# Patient Record
Sex: Female | Born: 1970 | Race: White | Hispanic: No | Marital: Single
Health system: Midwestern US, Academic
[De-identification: ages and names within clinical notes are randomized; demographics above are authoritative.]

---

## 2017-10-12 ENCOUNTER — Encounter: Admit: 2017-10-12 | Discharge: 2017-10-12 | Payer: BC Managed Care – PPO

## 2017-10-12 DIAGNOSIS — M549 Dorsalgia, unspecified: Principal | ICD-10-CM

## 2017-10-17 ENCOUNTER — Encounter: Admit: 2017-10-17 | Discharge: 2017-10-17 | Payer: BC Managed Care – PPO

## 2017-10-17 ENCOUNTER — Ambulatory Visit: Admit: 2017-10-17 | Discharge: 2017-10-17 | Payer: BC Managed Care – PPO

## 2017-10-17 DIAGNOSIS — F419 Anxiety disorder, unspecified: ICD-10-CM

## 2017-10-17 DIAGNOSIS — T148XXA Other injury of unspecified body region, initial encounter: ICD-10-CM

## 2017-10-17 DIAGNOSIS — M549 Dorsalgia, unspecified: Principal | ICD-10-CM

## 2017-10-17 DIAGNOSIS — S39012A Strain of muscle, fascia and tendon of lower back, initial encounter: ICD-10-CM

## 2017-10-17 DIAGNOSIS — I10 Essential (primary) hypertension: ICD-10-CM

## 2017-10-17 DIAGNOSIS — K219 Gastro-esophageal reflux disease without esophagitis: ICD-10-CM

## 2017-10-17 DIAGNOSIS — Z9889 Other specified postprocedural states: ICD-10-CM

## 2017-10-17 DIAGNOSIS — E079 Disorder of thyroid, unspecified: ICD-10-CM

## 2017-10-17 DIAGNOSIS — T7840XA Allergy, unspecified, initial encounter: Principal | ICD-10-CM

## 2018-01-16 ENCOUNTER — Encounter: Admit: 2018-01-16 | Discharge: 2018-01-16 | Payer: BC Managed Care – PPO

## 2018-01-16 ENCOUNTER — Ambulatory Visit: Admit: 2018-01-16 | Discharge: 2018-01-17 | Payer: BC Managed Care – PPO

## 2018-01-16 DIAGNOSIS — T148XXA Other injury of unspecified body region, initial encounter: ICD-10-CM

## 2018-01-16 DIAGNOSIS — K219 Gastro-esophageal reflux disease without esophagitis: ICD-10-CM

## 2018-01-16 DIAGNOSIS — Z9889 Other specified postprocedural states: ICD-10-CM

## 2018-01-16 DIAGNOSIS — S39012D Strain of muscle, fascia and tendon of lower back, subsequent encounter: Principal | ICD-10-CM

## 2018-01-16 DIAGNOSIS — I10 Essential (primary) hypertension: ICD-10-CM

## 2018-01-16 DIAGNOSIS — F419 Anxiety disorder, unspecified: ICD-10-CM

## 2018-01-16 DIAGNOSIS — T7840XA Allergy, unspecified, initial encounter: Principal | ICD-10-CM

## 2018-01-16 DIAGNOSIS — E079 Disorder of thyroid, unspecified: ICD-10-CM

## 2021-05-26 ENCOUNTER — Inpatient Hospital Stay
Admit: 2021-05-26 | Discharge: 2021-05-26 | Payer: PRIVATE HEALTH INSURANCE | Attending: Student in an Organized Health Care Education/Training Program

## 2021-05-26 DIAGNOSIS — K029 Dental caries, unspecified: Secondary | ICD-10-CM

## 2021-05-26 NOTE — Unmapped (Signed)
[place]  Oral Maxillofacial Surgery      Visit Type:  OMS NPV  Pt. Name: Gina Frey  Pt. MRN: [id number]  DOB: [birth year]              Sex: female  Visit Date:  05/26/2021  Provider: Attending: Harlow Mares, DMD  Resident: Danton Clap, DDS  Location of Care: [facility]    Subjective:      Chief Complaint: I need all my teeth out    Gina Frey is a/an 50 y.o. female referred from general dentist for extraction of remaining dentition #2-17,20-31.  Patient reports intermittent pain that is well controlled with pain medications. Pt to have dentures made by general dentist. Pt denies swelling, drainage, trismus, NVFC, dyspnea, dysphagia, dysphonia, shortness of breath, chest pain or any other symptoms.    Past Med/Surg/Family/Social History:    Allergies:   Tetnus  Medical History:   CVA, DMII, Asthma, HTN, HLD, Seizures  Medications:   Levemir, HTZ, Prednisone 60mg , Aspirin, Cetrizine, Iron, Metformin, Amlodipine  Surgical History:  ??? Left Cardiac Catheterization N/A 12/08/2020   LEFT CARDIAC CATHETERIZATION performed by Shelby Mattocks, MD at [facility] CATH LAB   ??? Other Surgical History   tonsillectomy   ??? Other Surgical History   kidney stones removed     Social History:  Social History     Occupational History   ??? Not on file   Tobacco Use   ??? Smoking status: Not on file   ??? Smokeless tobacco: Not on file   Substance and Sexual Activity   ??? Alcohol use: Not on file   ??? Drug use: Not on file   ??? Sexual activity: Not on file       ROS:   Review of Systems: 10-point ROS completed and is negative except noted in H&P.         Objective:     Vitals:    05/26/21 1016   BP: 140/68     Body mass index is 37.59 kg/m??.    Physical Exam    Maxillofacial:   ?? Atraumatic  ?? Normo-cephalic  ?? No facial swelling  ?? No cervical masses or LAD  ?? No pain to digital palpation - bilaterally  ?? No clicking/popping/crepitus of TMJ  ?? Normal anterior and laterotrusive movements  ?? No trismus  ?? CN II-XII  intact  ?? Maximum incisal opening WNL    Oral:   ?? Caries noted on remaining dentition  ?? Normal salivary flow, mucosa moist and pink  ?? No vestibular edema/swelling/erythema  ?? No uvular deviation, FOM soft and non-tender  ?? No signs of acute infection  ?? No purulence or drainage or fistulae noted  ?? No soft tissue pathology      Radiographic Evaluation/Imaging    Maxillary sinuses are equal in size and radiodensity. Mandibular condyles are well-formed and seated in the glenoid fossa.  No other radiographic evidence of maxillary or mandibular pathology. Caries noted on remaining dentition. Generalized bone loss.    Assessment/Plan:     Gina Frey is a/an 50 y.o. female with symptomatic, terminal teeth indicated for extraction under local anesthesia. Complex PMH with CVA with episodes in 11/21 and 3/22. With the most recent being accompanied by a seizure which was the pt's first of life and has not had another seizure since. Pt not on any blood thinners. DMII with last a1c of 7.6. pt to have biopsy by neurology for possible temporal  arteritis next week. Pt is on 60mg  prednisone daily for this. Discussed with pt that it would be beneficial to postpone surgery until after discontinuation of prednisone. Plan to complete MMOA over 4 separate appointments to limit stress.    ASA Classification: 3    1. Return for [facility] under local anesthesia over 4 appointments.  2. Informed consent will be obtained on day of surgery.  3. Risks & Benefits: Risks, benefits, complications and treatment options discussed with patient.    Danton Clap, DDS  05/26/2021 2:52 PM  [facility]  [facility] CENTER ORAL AND MAXILLOFACIAL SURGERY AT [facility]  [address]  [place] [address]  Dept: [phone]

## 2021-06-15 NOTE — Unmapped (Signed)
Received referral from [facility] to schedule patient for aud/hae. Patient has C[facility]. Referred to [facility]. S/W patient and gave them their telephone number. Faxed referral to [facility]. Also called referring office and was left on hold. Hung up. Sent fax to referring phy office alerting them that we sent referralto [facility] as we do not take [employer] for Aids.  Sent to scanning.

## 2021-09-18 ENCOUNTER — Inpatient Hospital Stay: Admit: 2021-09-18 | Discharge: 2021-09-18 | Payer: PRIVATE HEALTH INSURANCE

## 2021-09-18 DIAGNOSIS — G8918 Other acute postprocedural pain: Secondary | ICD-10-CM

## 2021-09-18 MED ORDER — chlorhexidine (PERIDEX) 0.12 % solution
0.12 | Freq: Two times a day (BID) | 0 refills | 16.00000 days | Status: AC
Start: 2021-09-18 — End: ?

## 2021-09-18 MED ORDER — oxyCODONE (ROXICODONE) 5 MG immediate release tablet
5 | ORAL_TABLET | Freq: Four times a day (QID) | ORAL | 0 refills | 6.00000 days | Status: AC | PRN
Start: 2021-09-18 — End: 2021-09-20

## 2021-09-18 MED ORDER — ibuprofen (MOTRIN) 600 MG tablet
600 | ORAL_TABLET | Freq: Four times a day (QID) | ORAL | 0 refills | Status: AC | PRN
Start: 2021-09-18 — End: ?

## 2021-09-18 MED ORDER — acetaminophen (TYLENOL) 325 MG tablet
325 | ORAL_TABLET | Freq: Four times a day (QID) | ORAL | 0 refills | 11.00000 days | Status: AC | PRN
Start: 2021-09-18 — End: ?

## 2021-09-18 NOTE — Unmapped (Signed)
[place]   Oral Maxillofacial Surgery      Pt. Name: Gina Frey  Pt. MRN: [id number] DOB: Feb 03, 1971  Provider: Lamount Cohen, DDS  Resident: Dewain Penning, DDS  Location of Care: [facility] at [facility]    Procedure: Extraction #9-16  Date of Procedure: 09/18/2021    Reviewed H&P; no significant changes to medical history or medications.    Informed Consent/Counseling Statement:  Plan, alternatives and risks of procedure have been explained to and discussed with the patient . By my assessment, they understand and agree. Scenario presented in detail. Question answered.    Anesthesia:   Local Anesthesia   Informed consent was performed. Time Out performed.    Local anesthesia administered: 3 carpules of lidocaine 2% with 1:100k epi.                                                        Anesthesia administered via block and local infiltration. After anesthesia was achieved, gauze throat pack placed and bite block inserted.    Maxillary Left Quadrant  Local Anesthesia administered in maxillary left quadrant via block and local infiltration. After anesthesia achieved, #15 scalpel blade used to make sulcular incision from distal of terminal tooth to the midline. Full-thickness mucoperisteal flap then elevated using periosteal elevator. Using combination of dental elevators and forceps, teeth # 9, 10, 11, 12, 13, 14, 15 and 16 were extracted without complication. Copious normal saline irrigation of extraction sites and underneath the flap. Reapproximation of tissues then completed using chromic gut 3.0 suture in a standard running fashion. Gauze packs placed, hemostasis achieved.  Gauze pack placed, hemostasis achieved.      Complications/Abnormal findings: N/A  Estimated Blood Loss: Minimal  Patient tolerated anesthesia and procedure well.    Rx: Acetaminophen 650mg  x Q6H PRN x 56 tabs, Ibuprofen 600mg  x Q6H PRN x 30 tabs, Peridex 0.12% rinse S/S 15mL BID x 1 week  bottle, Oxycodone 5mg  x Q6H PRN x 6 tabs  Postoperative instructions given both verbally and written. Extra gauze packs given to patient.  Disposition: Patient to follow up on an as needed basis    Signed by: Dewain Penning  Date: 09/18/2021 Time: 3:19 PM

## 2021-10-02 ENCOUNTER — Inpatient Hospital Stay
Admit: 2021-10-02 | Discharge: 2021-10-02 | Payer: PRIVATE HEALTH INSURANCE | Attending: Student in an Organized Health Care Education/Training Program

## 2021-10-02 DIAGNOSIS — K056 Periodontal disease, unspecified: Secondary | ICD-10-CM

## 2021-10-02 LAB — POC GLU MONITORING DEVICE: POC Glucose Monitoring Device: 161 mg/dL (ref 70–100)

## 2021-10-02 MED ORDER — ibuprofen (MOTRIN) 600 MG tablet
600 | ORAL_TABLET | Freq: Four times a day (QID) | ORAL | 0 refills | Status: AC | PRN
Start: 2021-10-02 — End: ?

## 2021-10-02 MED ORDER — oxyCODONE (ROXICODONE) 5 MG immediate release tablet
5 | ORAL_TABLET | Freq: Four times a day (QID) | ORAL | 0 refills | 6.00000 days | Status: AC | PRN
Start: 2021-10-02 — End: 2021-10-07

## 2021-10-02 MED ORDER — HYDROcodone-acetaminophen (NORCO) 5-325 mg per tablet
5-325 | ORAL_TABLET | Freq: Four times a day (QID) | ORAL | 0 refills | 15.50000 days | Status: AC | PRN
Start: 2021-10-02 — End: 2021-10-02

## 2021-10-02 NOTE — Unmapped (Signed)
[place]   Oral Maxillofacial Surgery    Pt. Name: Gina Frey  Pt. MRN: [id number]  DOB: 02/06/1971            Sex: female  Provider: Maeola Harman, DMD  Resident:Jakub Debold Merri Brunette, DMD  Location of Care: [facility] at [facility]    Procedure: Full Mouth Extractions  Date of Procedure: 10/02/2021    H&P/Consult within 30 days of procedure: No (no changes)    Informed Consent/Counseling Statement:  Plan, alternatives and risks of anesthesia, including death have been explained to and discussed with the patient/legal guardian.  By my assessment, the patient/legal guardian understands and agrees. Scenario presented in detail. Question answered.    Anesthesia: Local  Informed consent was performed. Time Out performed. Standard ASA monitors were instituted.    Gauze throat pack placed in oropharynx, bite block inserted.  Local anesthesia administered: 6 carpules of lidocaine 2% with 1:100k epi.                                  1 carpules of marcaine 0.5% with 1:200k epi.                                 Maxillary Right Quadrant  Local Anesthesia administered in maxillary right quadrant via block and local infiltration. After anesthesia achieved, #15 scalpel blade used to make sulcular incision from distal of terminal tooth to the midline. Full-thickness mucoperisteal flap then elevated using periosteal elevator. Using combination of dental elevators and forceps, teeth # 2, 3, 4, 5, 6, 7 and 8 were extracted without complication. Alveoloplasty of the maxillary right quadrant was then completed using a rongeur and bone rasp. Copious normal saline irrigation of extraction sites and underneath the flap. Reapproximation of tissues then completed using chromic gut 3.0 suture in a standard running fashion. Gauze packs placed, hemostasis achieved.    Mandibular Right Quadrant  Local Anesthesia administered in mandibular right quadrant via block and local infiltration. After anesthesia  achieved, #15 scalpel blade used to make sulcular incision from distal of terminal tooth to the midline. Full-thickness mucoperisteal flap then elevated using periosteal elevator. Using combination of dental elevators and forceps, teeth # 25, 26, 27, 28, 29, 30 and 31 were extracted without complication. Alveoloplasty of the mandibular right quadrant was then completed using a rongeur and bone rasp. Copious normal saline irrigation of extraction sites and underneath the flap. Reapproximation of tissues then completed using chromic gut 3.0 suture in a standard running fashion. Gauze packs placed, hemostasis achieved.      Thorough suctioning of the oropharynx, removal of throat pack and bite block.  Complications/Abnormal findings: none  Estimated Blood Loss: Minimal  Patient tolerated anesthesia and procedure well.    Rx: Ibuprofen 600mg  x 30 tabs  Tylenol 500mg  x 30 tabs  Norco 5/325mg  x 12 tabs    Postoperative instructions given both verbally and written. Extra gauze packs given to patient.  Disposition: Patient to return for LLQ extractions at next visit 10/16/21 to complete MMOA    Signed by: Murriel Hopper  Date: 10/02/21   Time: 12:30 PM

## 2021-10-16 ENCOUNTER — Inpatient Hospital Stay
Admit: 2021-10-16 | Discharge: 2021-10-16 | Payer: PRIVATE HEALTH INSURANCE | Attending: Student in an Organized Health Care Education/Training Program

## 2021-10-16 DIAGNOSIS — K029 Dental caries, unspecified: Secondary | ICD-10-CM

## 2021-10-16 LAB — POC GLU MONITORING DEVICE: POC Glucose Monitoring Device: 150 mg/dL (ref 70–100)

## 2021-10-16 MED ORDER — oxyCODONE (ROXICODONE) 5 MG immediate release tablet
5 | ORAL_TABLET | Freq: Four times a day (QID) | ORAL | 0 refills | 6.00000 days | Status: AC | PRN
Start: 2021-10-16 — End: 2021-10-19

## 2021-10-16 MED ORDER — chlorhexidine (PERIDEX) 0.12 % solution
0.12 | Freq: Two times a day (BID) | 0 refills | 16.00000 days | Status: AC
Start: 2021-10-16 — End: ?

## 2021-10-16 MED ORDER — acetaminophen (TYLENOL) 500 MG tablet
500 | ORAL_TABLET | Freq: Four times a day (QID) | ORAL | 0 refills | 11.00000 days | Status: AC | PRN
Start: 2021-10-16 — End: ?

## 2021-10-16 NOTE — Unmapped (Signed)
[name]   Oral Maxillofacial Surgery    Pt. Name: Gina Frey  Pt. MRN: [id number]  DOB: [birth year]            Sex: female  Provider: Maeola HarmanMichael Grau, DMD  Resident:Cyle Kenyon, DDS  [facility]    Procedure: Full Mouth Extractions  Date of Procedure: 10/16/2021    H&P/Consult within 30 days of procedure: No (no changes)    Informed Consent/Counseling Statement:  Plan, alternatives and risks of anesthesia, including death have been explained to and discussed with the patient/legal guardian.  By my assessment, the patient/legal guardian understands and agrees. Scenario presented in detail. Question answered.    Anesthesia: Local  Informed consent was performed. Time Out performed. Standard ASA monitors were instituted.    Gauze throat pack placed in oropharynx, bite block inserted.  Local anesthesia administered: 3 carpules of lidocaine 2% with 1:100k epi.                                  1 carpules of marcaine 0.5% with 1:200k epi.                                   Mandibular Left Quadrant  Local Anesthesia administered in mandibular left quadrant via block and local infiltration. After anesthesia achieved, Full-thickness mucoperisteal flap then elevated using periosteal elevator. Using combination of dental elevators and forceps, teeth #17, 20, 21, 22, 23 and 24 were extracted without complication. Alveoloplasty of the mandibular left quadrant was then completed using a rongeur and bone rasp. Copious normal saline irrigation of extraction sites and underneath the flap. Reapproximation of tissues then completed using chromic gut 3.0 suture in a standard running fashion. Gauze packs placed, hemostasis achieved.    Thorough suctioning of the oropharynx, removal of throat pack and bite block.  Complications/Abnormal findings: n/a  Estimated Blood Loss: Minimal  Patient tolerated anesthesia and procedure well.    Rx: Oxycodone 5mg  x12  tabs  Tylenol 325mg  x 40 tabs  Peridex 473mL    Postoperative instructions given both verbally and written. Extra gauze packs given to patient.  Disposition: Patient to follow up on an as needed basis    Signed by: Danton ClapREVOR Zeya Balles  Date: 10/16/21   Time: 11:27 AM

## 2021-10-30 ENCOUNTER — Ambulatory Visit: Payer: PRIVATE HEALTH INSURANCE

## 2022-01-21 ENCOUNTER — Encounter: Admit: 2022-01-21 | Discharge: 2022-01-21 | Payer: BC Managed Care – PPO

## 2022-01-21 ENCOUNTER — Ambulatory Visit: Admit: 2022-01-21 | Discharge: 2022-01-21 | Payer: BC Managed Care – PPO

## 2022-01-21 DIAGNOSIS — M5417 Radiculopathy, lumbosacral region: Secondary | ICD-10-CM

## 2022-01-21 DIAGNOSIS — M62838 Other muscle spasm: Secondary | ICD-10-CM

## 2022-01-21 DIAGNOSIS — M47819 Spondylosis without myelopathy or radiculopathy, site unspecified: Secondary | ICD-10-CM

## 2022-01-21 DIAGNOSIS — M5137 Other intervertebral disc degeneration, lumbosacral region: Secondary | ICD-10-CM

## 2022-01-21 MED ORDER — TIZANIDINE 2 MG PO TAB
2-4 mg | ORAL_TABLET | Freq: Every evening | ORAL | 3 refills | Status: AC | PRN
Start: 2022-01-21 — End: ?

## 2022-01-21 NOTE — Progress Notes
Dear Dr. Christene Lye Darland,    I appreciate your kind referral of Briana Blankenship for evaluation of pain. Please see my note below for the full details of the evaluation and management plan.    Thank you,    Evelina Bucy, MD        [facility]  NEW PATIENT HISTORY AND PHYSICAL  Subjective     Chief Complaint: Pain  Chief Complaint   Patient presents with   ? Left Hip - Pain   ? Left Heel - Pain   ? New Patient       HPI: Briana Blankenship is a 51 y.o. female who  has a past medical history of Allergy, Anxiety disorder, GERD (gastroesophageal reflux disease), Hypertension, Nerve injury (06/2013), and Thyroid disease. who presents for evaluation.    The pain is in the LBP on the left side.     There is radicular pain down the LLE in the posterolateral distribution    Pain started: 0-6 months  Flared up since 09/2021    Initial inciting injury or event: No      Numbness/tingling: Leg      The pain averages Leg or foot  6/10    The pain is described as Aching, Stabbing, Nagging, Burning, Sharp, Unbearable      The pain is exacerbated by Other (comment) Varies      The pain is partially alleviated by Rest        +muscle stiffness/tightness    Patient endorses achiness/heaviness of legs with walking a long distance that improves with rest. Positive Shopping Cart sign.        PRIOR MEDICATIONS:   Effective      Ineffective  NSAID  Acetaminophen    Unable to tolerate  Flexeril    Never  Gabapentin   Lyrica  Ami/Nortriptyline  Cymbalta  Tizanidine      PRIOR INTERVENTIONS:  Lumbar discectomy around 2018  Effective      Ineffective  Physician-ordered PT  Chiropractor      Briana Blankenship denies any recent fevers, chills, infection, antibiotics, bowel or bladder incontinence, saddle anesthesia, bleeding issues, or recent anticoagulant.     ROS: All 14 systems reviewed and found to be negative except as above and as follows. +anxiety, reflux.     Past Medical History:  Medical History: Diagnosis Date   ? Allergy    ? Anxiety disorder    ? GERD (gastroesophageal reflux disease)    ? Hypertension    ? Nerve injury 06/2013   ? Thyroid disease        Family History:  Family History   Problem Relation Age of Onset   ? Hypertension Mother    ? Hypertension Father    ? Hypertension Sister    ? Hypertension Brother    ? Diabetes Paternal Grandmother    ? Hypertension Sister    ? Hypertension Brother        Social History:  Lives in [place] [address] (1 hour away)  Works as a Radiation protection practitioner.   Social History     Socioeconomic History   ? Marital status: Divorced   ? Number of children: 4   Occupational History   ? Occupation: Futures trader: [employer]   Tobacco Use   ? Smoking status: Former     Packs/day: 1.00     Years: 5.00     Pack years: 5.00  Types: Cigarettes     Quit date: 06/28/2010     Years since quitting: 11.5   ? Smokeless tobacco: Never   Substance and Sexual Activity   ? Alcohol use: Yes     Alcohol/week: 1.0 standard drink of alcohol     Types: 1 Shots of liquor per week   ? Drug use: No   ? Sexual activity: Yes     Partners: Male     Birth control/protection: Surgical       Allergies:  Allergies   Allergen Reactions   ? Relafen [Nabumetone] HIVES   ? Morphine VOMITING       Medications:    Current Outpatient Medications:   ?  amLODIPine (NORVASC) 10 mg tablet, Take one tablet by mouth daily., Disp: , Rfl:   ?  ibuprofen (ADVIL PO), Take  by mouth as Needed., Disp: , Rfl:   ?  lisinopril (PRINIVIL; ZESTRIL) 20 mg tablet, Take one tablet by mouth daily., Disp: , Rfl:     Physical examination:   BP (!) 166/88 (BP Source: Arm, Right Upper, Patient Position: Sitting)  - Pulse 58  - Ht 170.2 cm (5' 7)  - Wt 85.7 kg (189 lb)  - SpO2 100%  - BMI 29.60 kg/m?   Pain Score: Four  Oswestry Total Score:: 26    General: The patient is a well-developed, well nourished 51 y.o. female in no acute distress.   HEENT: Head is normocephalic and atraumatic. EOMI bilaterally.   Cardiac: Based on palpation, pulse appears to be regular rate and rhythm.   Pulmonary: The patient has unlabored respirations and bilateral symmetric chest excursion.   Abdomen: Soft, nontender, and nondistended. There is no rebound or guarding.   Extremities: No clubbing, cyanosis, or edema.     Neurologic:   The patient is alert and oriented times 3.   Cranial nerves II through XII are intact without any focal deficits.     Musculoskeletal:   Gait is mildly antalgic.    L-Spine   There is mild left low lumbar paraspinal tenderness. Paraspinal muscle tone is increased.  Facet loading is positive.  There is no tenderness or radiating pain with palpation over the SI joints, piriformis, or greater trochanteric bursae bilaterally.  ROM with flexion, extension, rotation, and lateral bending is intact.  Strength is equal and adequate bilaterally in the flexors and extensors of the bilateral lower extremities.   SLR is positive in the LLE.       CT Abd/Pelvis Results:  2019    Degenerative changes   scattered throughout spine.       XR L-Spine Results  2019    1. ?There is transitional lumbosacral anatomy with partial lumbarization   of S1. Lucency involving the right S1 pars interarticularis on the oblique   view, suggesting possible pars defect.     2. ?Vertebral body heights are maintained. There is normal lumbar   alignment without abnormal motion in flexion or extension.     3. ?Multilevel lumbar disc degeneration, greatest of moderate degree at   L5-S1. Mild facet arthrosis L5-S1.     4. ?Surgical clips overlie the right upper quadrant abdomen.         Last Cr and LFT's:  No results found for: CR, AST, ALT, ALKPHOS, TOTBILI       Assessment:    Briana Blankenship is a 51 y.o. female who  has a past medical history of Allergy, Anxiety disorder, GERD (gastroesophageal reflux disease),  Hypertension, Nerve injury (06/2013), and Thyroid disease. who presents for evaluation of pain.    The pain complaints are most likely due to:    1. Lumbosacral radiculopathy        2. DDD (degenerative disc disease), lumbosacral        3. Muscle spasm        4. Facet arthropathy            Patient has had an adequate trial of > 3 months of rest, exercise, multimodal treatment, and the passage of time without improvement of symptoms. The pain has significant impact on the daily quality of life.     Plan:    1. Plan for Left L4-S1 TFESI in PROCEDURES at first available appointment. She has partial lumbarization of S1.   2. Will trial tizanidine 2-4mg  qhs prn.   3. Check L-spine XR today.   4. Follow up as needed.     Risks/benefits of all pharmacologic and interventional treatments discussed and questions answered.     Thank you for this kind referral for consultation. Please feel free to contact me with any questions or concerns.

## 2023-01-06 ENCOUNTER — Encounter: Admit: 2023-01-06 | Discharge: 2023-01-06 | Payer: BC Managed Care – PPO

## 2023-01-10 ENCOUNTER — Encounter: Admit: 2023-01-10 | Discharge: 2023-01-10 | Payer: BC Managed Care – PPO

## 2023-01-11 ENCOUNTER — Encounter: Admit: 2023-01-11 | Discharge: 2023-01-11 | Payer: BC Managed Care – PPO

## 2023-01-11 NOTE — Telephone Encounter
01-11-2023 Per Task Message, request faxed to Dr Myriam Jacobson, (F) [phone], clp    Dr. Myriam Jacobson, [facility], [phone]

## 2023-02-10 ENCOUNTER — Encounter: Admit: 2023-02-10 | Discharge: 2023-02-10 | Payer: BC Managed Care – PPO

## 2023-02-24 IMAGING — CR [ID]
3 series · 3 of 3 positions shown · non-contrast
Comparison: none

[x foot ap left]
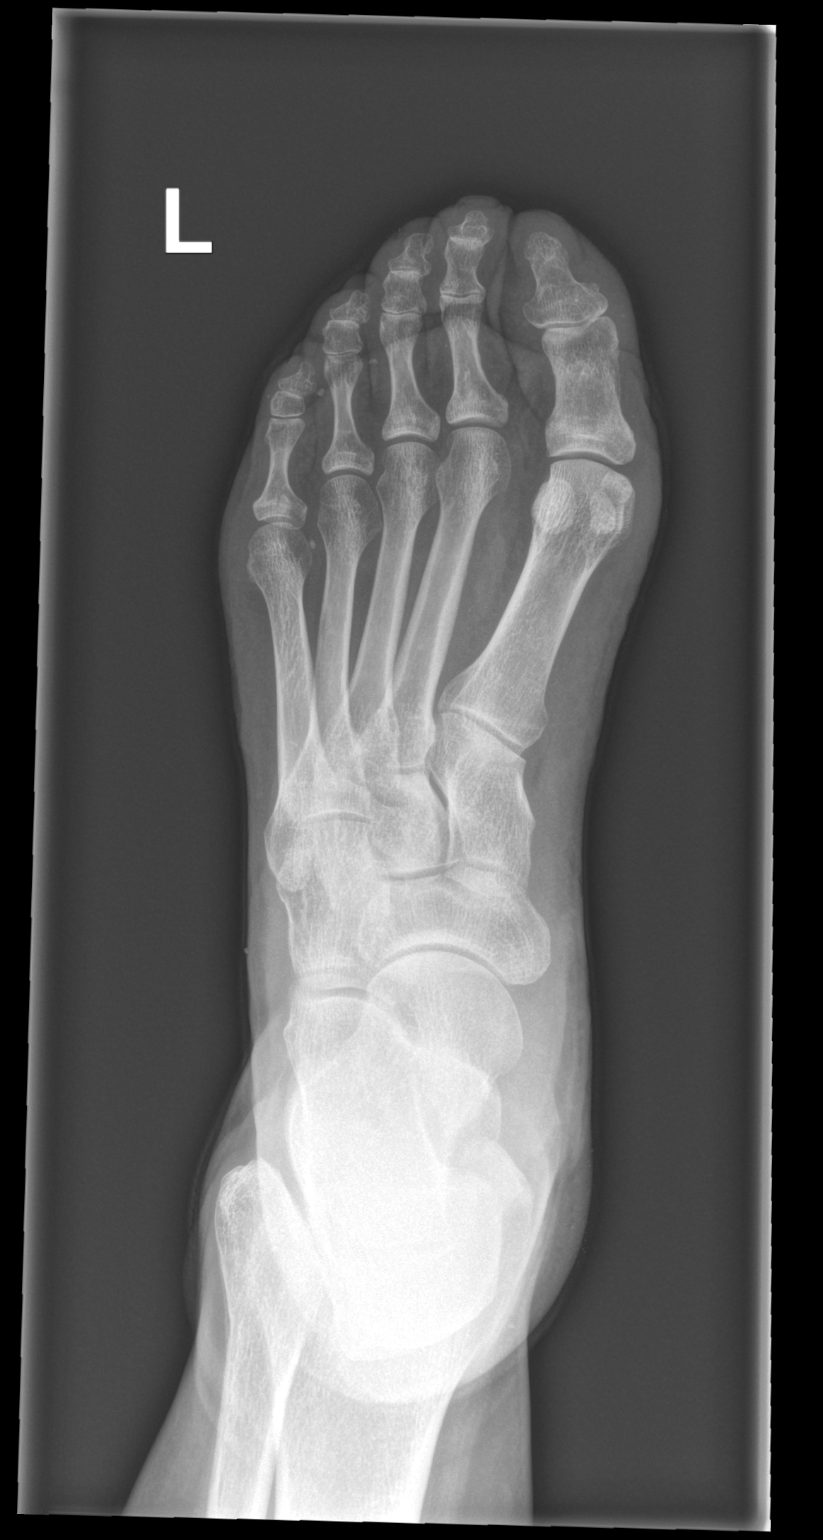

[x foot obl left]
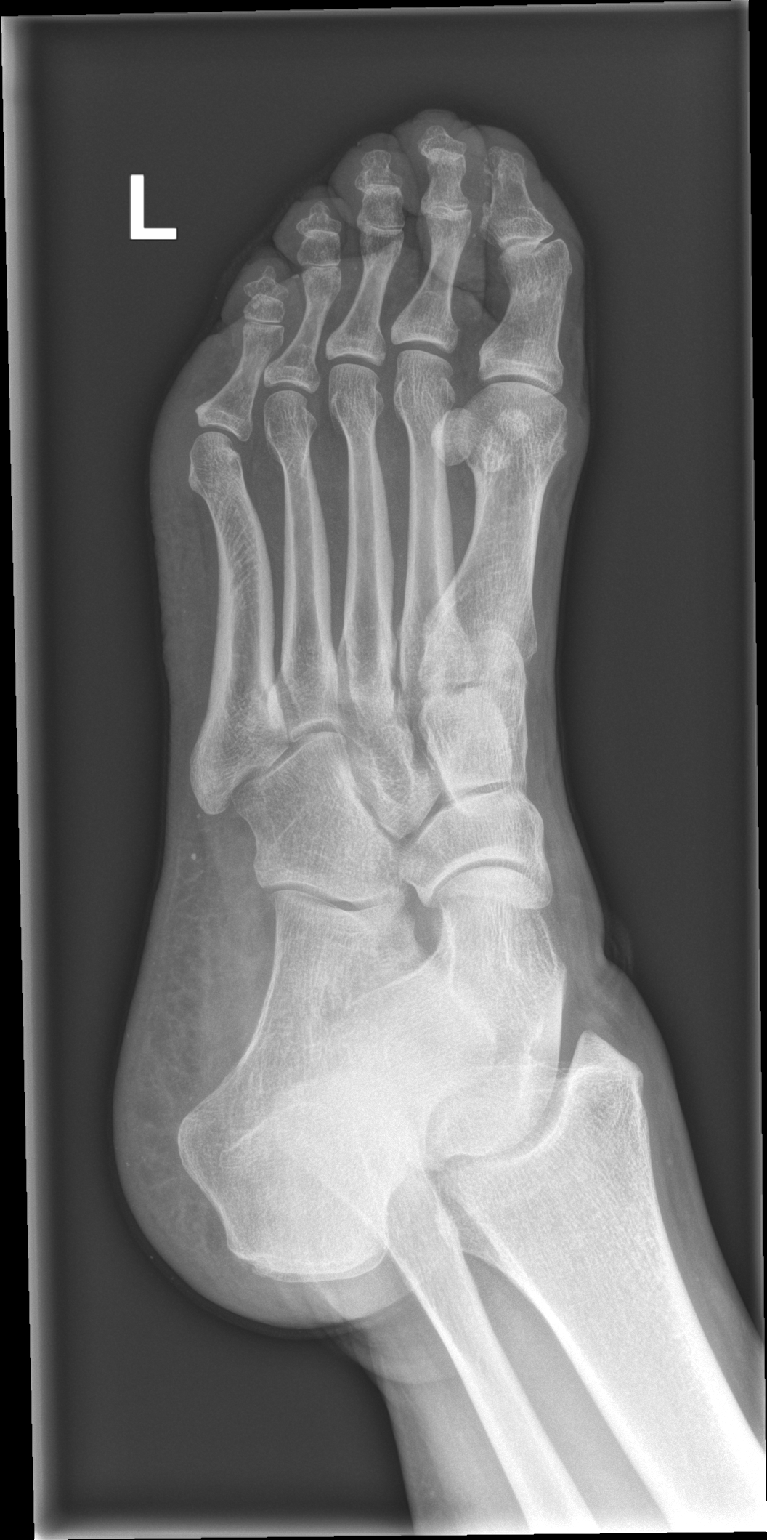

[x foot lat left]
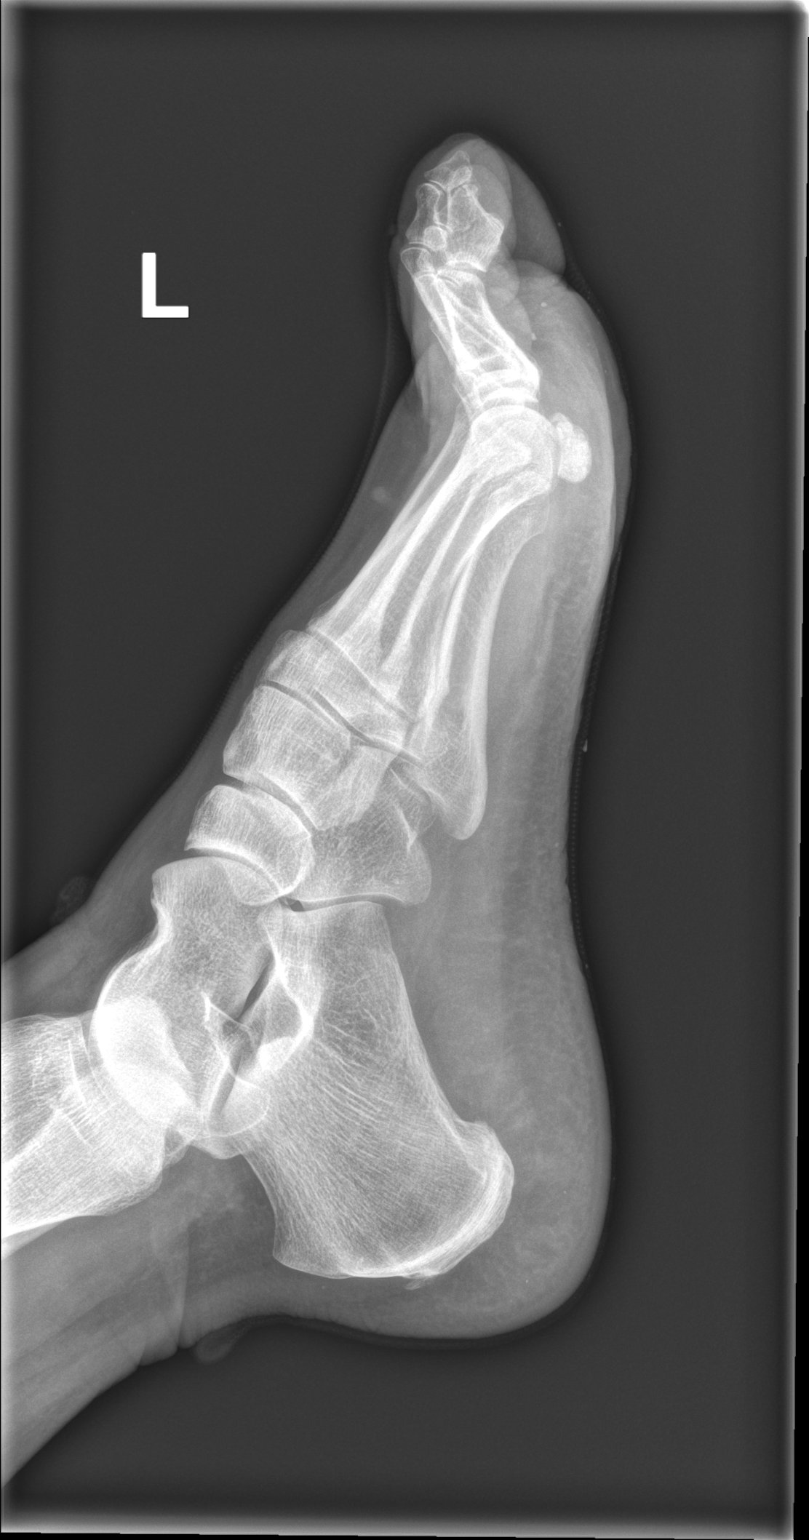

[3 of 3 positions shown; findings below may reference images not displayed]

EXAM

Left foot

INDICATION

left heel pain
HEEL AND ARCH PAIN X 5 MOS, ESPECIALLY WITH DISTANCE WALKING.  AB

FINDINGS

Three views of the left foot were obtained.

There are very small heel spurs at the insertion sites of the plantar fascia and Achilles tendon.
There is no fracture or acute appearing abnormality. There is mild narrowing of the MTP joint of the
great toe.

IMPRESSION

There are very small heel spurs. There is no acute appearing radiographed abnormality of the left
foot.

Tech Notes:

HEEL AND ARCH PAIN X 5 MOS, ESPECIALLY WITH DISTANCE WALKING.  AB

## 2023-08-04 ENCOUNTER — Encounter: Admit: 2023-08-04 | Discharge: 2023-08-04 | Payer: BC Managed Care – PPO
